# Patient Record
Sex: Male | Born: 1975 | Race: White | Hispanic: No | Marital: Married | Smoking: Former smoker
Health system: Southern US, Community
[De-identification: ages and names within clinical notes are randomized; demographics above are authoritative.]

## PROBLEM LIST (undated history)

## (undated) DIAGNOSIS — M1990 Unspecified osteoarthritis, unspecified site: Secondary | ICD-10-CM

## (undated) DIAGNOSIS — Z8659 Personal history of other mental and behavioral disorders: Secondary | ICD-10-CM

## (undated) DIAGNOSIS — R002 Palpitations: Secondary | ICD-10-CM

## (undated) DIAGNOSIS — F419 Anxiety disorder, unspecified: Secondary | ICD-10-CM

## (undated) DIAGNOSIS — I10 Essential (primary) hypertension: Secondary | ICD-10-CM

## (undated) HISTORY — PX: CYST EXCISION: SHX5701

## (undated) HISTORY — PX: SHOULDER SURGERY: SHX246

---

## 2004-02-26 ENCOUNTER — Ambulatory Visit: Payer: Self-pay | Admitting: Family Medicine

## 2004-12-05 ENCOUNTER — Ambulatory Visit: Payer: Self-pay | Admitting: Family Medicine

## 2013-01-20 ENCOUNTER — Ambulatory Visit: Payer: Self-pay | Admitting: Orthopedic Surgery

## 2015-10-26 ENCOUNTER — Other Ambulatory Visit: Payer: Self-pay | Admitting: Student

## 2015-10-26 DIAGNOSIS — M542 Cervicalgia: Secondary | ICD-10-CM

## 2015-10-26 DIAGNOSIS — M5413 Radiculopathy, cervicothoracic region: Secondary | ICD-10-CM

## 2015-11-05 ENCOUNTER — Ambulatory Visit
Admission: RE | Admit: 2015-11-05 | Discharge: 2015-11-05 | Disposition: A | Discharge status: Home / Self Care | Payer: Managed Care, Other (non HMO) | Source: Ambulatory Visit | Attending: Student | Admitting: Student

## 2015-11-05 DIAGNOSIS — M542 Cervicalgia: Secondary | ICD-10-CM

## 2015-11-05 DIAGNOSIS — M47892 Other spondylosis, cervical region: Secondary | ICD-10-CM | POA: Insufficient documentation

## 2015-11-05 DIAGNOSIS — M47894 Other spondylosis, thoracic region: Secondary | ICD-10-CM | POA: Diagnosis not present

## 2015-11-05 DIAGNOSIS — M5413 Radiculopathy, cervicothoracic region: Secondary | ICD-10-CM

## 2015-11-05 DIAGNOSIS — M50222 Other cervical disc displacement at C5-C6 level: Secondary | ICD-10-CM | POA: Insufficient documentation

## 2015-11-05 DIAGNOSIS — M5124 Other intervertebral disc displacement, thoracic region: Secondary | ICD-10-CM | POA: Diagnosis not present

## 2015-11-05 DIAGNOSIS — M4802 Spinal stenosis, cervical region: Secondary | ICD-10-CM | POA: Diagnosis not present

## 2017-06-22 ENCOUNTER — Other Ambulatory Visit: Payer: Self-pay | Admitting: General Surgery

## 2017-06-22 DIAGNOSIS — R1031 Right lower quadrant pain: Secondary | ICD-10-CM

## 2017-06-28 ENCOUNTER — Ambulatory Visit
Admission: RE | Admit: 2017-06-28 | Discharge: 2017-06-28 | Disposition: A | Discharge status: Home / Self Care | Payer: 59 | Source: Ambulatory Visit | Attending: General Surgery | Admitting: General Surgery

## 2017-06-28 DIAGNOSIS — R1031 Right lower quadrant pain: Secondary | ICD-10-CM | POA: Diagnosis present

## 2017-06-28 DIAGNOSIS — K4090 Unilateral inguinal hernia, without obstruction or gangrene, not specified as recurrent: Secondary | ICD-10-CM | POA: Insufficient documentation

## 2017-06-28 DIAGNOSIS — K760 Fatty (change of) liver, not elsewhere classified: Secondary | ICD-10-CM | POA: Diagnosis not present

## 2017-06-28 MED ORDER — IOPAMIDOL (ISOVUE-300) INJECTION 61%
100.0000 mL | Freq: Once | INTRAVENOUS | Status: AC | PRN
  Administered 2017-06-28: 100 mL via INTRAVENOUS

## 2018-02-19 ENCOUNTER — Inpatient Hospital Stay
Admission: RE | Admit: 2018-02-19 | Discharge: 2018-02-19 | Disposition: A | Discharge status: Home / Self Care | Payer: 59 | Source: Ambulatory Visit

## 2018-02-19 NOTE — Pre-Procedure Instructions (Signed)
Progress Notes - documented in this encounter Ladean RayaDrane, [place], PA - 12/24/2017 9:15 AM EST Formatting of this note might be different from the original. Established Patient Visit   Chief Complaint: Chief Complaint  Patient presents with  . 4 mnth follow up  no compliaints  Date of Service: 12/24/2017 Date of Birth: 08-Apr-1975 PCP: Dionisio PaschalBronstein, David Marvin, MD  History of Present Illness: Mr. Charles Mcintyre is a 43 y.o.male patient who returns today for essential hypertension, and a recent history of palpitations, headache and heat intolerance. The patient has a history of essential hypertension, treated with antihypertensive medications for 5 years. During the past 5 years, the patient has been experiencing worsening heat intolerance. He is a Secondary school teacherbaseball coach, works outdoors during the summer, and has noted worsening intolerance to heat with associated progressive headache and palpitations, which proceeds to nausea. At a recent visit, his bisoprolol-hydrochlorothiazide was discontinued, and he was started on metoprolol succinate 50 mg. He reports an overall improvement in his symptoms. The patient denies chest pain or shortness of breath. He denies palpitations or heart racing. He denies presyncope or syncope. The patient is active, does weight lifting 3-4 times a week, but does not do any cardio exercises.  The patient has essential hypertension, blood pressure adequately controlled today, currently on metoprolol succinate, which is well-tolerated without apparent side effects. The patient tries to follow low-sodium, no added salt diet.  Past Medical and Surgical History  Past Medical History Past Medical History:  Diagnosis Date  . Hypertension   Past Surgical History He has a past surgical history that includes cyst on hand.   Medications and Allergies  Current Medications  Current Outpatient Medications  Medication Sig Dispense Refill  . metoprolol succinate (TOPROL-XL) 50 MG XL tablet Take  1 tablet (50 mg total) by mouth once daily 30 tablet 11  . sildenafil, antihypertensive, (REVATIO) 20 mg tablet TAKE 1-5 TABLETS BY MOUTH AS NEEDED BEFORE SEXUAL ACTIVITY 30 tablet 1  . testosterone cypionate (DEPO-TESTOSTERONE) 200 mg/mL injection Inject 1ML Q 14 days 3 mL 1   No current facility-administered medications for this visit.   Allergies: Patient has no known allergies.  Social and Family History  Social History reports that he has quit smoking. He has never used smokeless tobacco. He reports that he does not drink alcohol or use drugs.  Family History Family History  Problem Relation Age of Onset  . High blood pressure (Hypertension) Mother  . High blood pressure (Hypertension) Father  . High blood pressure (Hypertension) Brother  . Myocardial Infarction (Heart attack) Maternal Grandfather   Review of Systems   Review of Systems: The patient denies chest pain, shortness of breath, orthopnea, paroxysmal nocturnal dyspnea, pedal edema, palpitations, heart racing, presyncope, syncope, with heat intolerance and headaches. Review of 10 Systems is negative except as described above.  Physical Examination   Vitals:BP 124/82  Pulse 59  Resp 16  Ht 177.8 cm (5\' 10" )  Wt 98.2 kg (216 lb 7.9 oz)  BMI 31.06 kg/m  Ht:177.8 cm (5\' 10" ) Wt:98.2 kg (216 lb 7.9 oz) ZOX:WRUEBSA:Body surface area is 2.2 meters squared. Body mass index is 31.06 kg/m.  General: Alert and oriented. Well-appearing. No acute distress. HEENT: Pupils equally reactive to light and accomodation  Neck: Supple, no JVD Lungs: Normal effort of breathing; clear to auscultation bilaterally; no wheezes, rales, rhonchi Heart: Regular rate and rhythm. No murmur, rub, or gallop Abdomen: nondistended Extremities: no cyanosis, clubbing, or edema Peripheral Pulses: 2+ radial  Skin: Warm, dry,  no diaphoresis  Assessment   43 y.o. male with  1. Essential hypertension   42 year old gentleman with recent history of  increasing heat intolerance, headache, and palpitations, with elevated blood pressure on bisoprolol/HCTZ, which was discontinued, and switched to metoprolol succinate, with overall improvement in symptoms, with good control of blood pressure. TSH within normal limits.  Plan   1. Continue current medications. 2. Counseled patient about low sodium diet 3. DASH diet printed instructions given to the patient 4. Advised patient to stay well hydrated 5. Return to clinic for follow-up in 6 months, sooner if needed.  No orders of the defined types were placed in this encounter.  Return in about 6 months (around 06/24/2018). I personally performed the service, non-incident to. [facility])  ANNA Domenica Reamer, PA-C   Electronically signed by Ladean Raya, PA at 12/24/2017 12:41 PM EST   Plan of Treatment - documented as of this encounter Upcoming Encounters Upcoming Encounters  Date Type Specialty Care Team Description  02/20/2018 Office Visit Internal Medicine Terance Hart Rogelia Mire, MD  [address]  [place], [place] [address]  [phone]  [phone] (Fax)    03/07/2018 Post Op General Surgery Cintron Netta Cedars, MD  [address]  [place], [place] [address]  [phone]  [phone] (Fax)     Goals - documented as of this encounter Goal Patient Goal Type Associated Problems Recent Progress Patient-Stated? Author  Lose Weight  Weight   [place], [place], [place]  Note:   Pt's goal is to lose weight. Pt will eat less fast food and do cardio 3-5x a week. He will do this at his gym in the am. importance is an 8. Will follow up at next visit.     Visit Diagnoses - documented in this encounter Diagnosis  Essential hypertension - Primary    Discontinued Medications - documented as of this encounter Medication Sig Discontinue Reason Start Date End Date  betamethasone dipropionate, augmented, (DIPROLENE-AF) 0.05 % cream    01/17/2017 12/24/2017    Images  Patient Contacts  Contact Name Contact Address Communication Relationship to Patient  Ensar Aungst [address] [place], [place] [address] [phone] [facility]) [phone] (Home) Spouse, Emergency Contact  Document Information  Primary Care Provider Other Service Providers Document Coverage Dates  Dionisio Paschal, MD (Dec. 21, 2015December 21, 2015 - Present) DM: [phone] (Work) [phone] (Fax)  Family Medicine [facility] [address] [place], [place] [address]  Nov. 11, 2019November 11, 2019   Custodian Organization  [facility] [address] [place], [place] [address]   Encounter Providers Encounter Date  Ladean Raya, [place] (Attending) DM: [phone] (Work) [phone] (Fax) [address] [facility] [place], [place] [address] Cardiovascular Disease Nov. 11, 2019November 11, 2019

## 2018-02-20 ENCOUNTER — Inpatient Hospital Stay: Admission: RE | Admit: 2018-02-20 | Payer: Managed Care, Other (non HMO) | Source: Ambulatory Visit

## 2018-02-21 ENCOUNTER — Encounter
Admission: RE | Admit: 2018-02-21 | Discharge: 2018-02-21 | Disposition: A | Discharge status: Home / Self Care | Payer: Managed Care, Other (non HMO) | Source: Ambulatory Visit | Attending: General Surgery | Admitting: General Surgery

## 2018-02-21 ENCOUNTER — Other Ambulatory Visit: Payer: Self-pay

## 2018-02-21 ENCOUNTER — Other Ambulatory Visit: Payer: Managed Care, Other (non HMO)

## 2018-02-21 ENCOUNTER — Ambulatory Visit: Payer: Self-pay | Admitting: General Surgery

## 2018-02-21 HISTORY — DX: Anxiety disorder, unspecified: F41.9

## 2018-02-21 HISTORY — DX: Palpitations: R00.2

## 2018-02-21 HISTORY — DX: Unspecified osteoarthritis, unspecified site: M19.90

## 2018-02-21 HISTORY — DX: Essential (primary) hypertension: I10

## 2018-02-21 HISTORY — DX: Personal history of other mental and behavioral disorders: Z86.59

## 2018-02-21 NOTE — H&P (Signed)
HISTORY OF PRESENT ILLNESS:    Charles Mcintyre is a 42 y.o.male patient who comes for pre operative evaluation of right inguinal hernia and umbilical hernia.  Charles Mcintyre right groin pain after lifting weights 7-8 months ago. Patient refers continue with pain on the right inguinal area and does not radiates to other part of the body. The pain is aggravated with bending down or moving the proximal thigh. Pain improves spontaneously, patient has not taken any pain medications. Patient refers that the right groin area is more swollen than the left. Denies episodes of abdominal distention, nausea and vomiting.  He was previously evaluated on May 2019. Ct Scan was ordered that confirm the right inguinal and umbilical hernia. I personally evaluated the images.       PAST MEDICAL HISTORY:      Past Medical History:  Diagnosis Date  . Hypertension         PAST SURGICAL HISTORY:        Past Surgical History:  Procedure Laterality Date  . cyst on hand           MEDICATIONS:  EncounterMedications        Outpatient Encounter Medications as of 02/01/2018  Medication Sig Dispense Refill  . metoprolol succinate (TOPROL-XL) 50 MG XL tablet Take 1 tablet (50 mg total) by mouth once daily 30 tablet 11  . sildenafil, pulm.hypertension, (REVATIO) 20 mg tablet TAKE 1-5 TABLETS BY MOUTH AS NEEDED BEFORE SEXUAL ACTIVITY 30 tablet 1  . testosterone cypionate (DEPO-TESTOSTERONE) 200 mg/mL injection INJECT <MEASUREMENTPriciMarcelle Sm55JeSt. BeMarland KitchenrnGa8675 5PriciMarcelle Sm10JeMeMarland KitchenlbGa3474 7PriciMarcelle Sm16JeMarland KitchenAvGa6699 0PriciMarcelle Sm59JePenniMarland KitchenngGa860 7PriciMarcelle Sm45JeMarland KitchenRoGa367 2PriciMarcelle Sm41JeYaMarland KitchennkGa2362 5PriciMarcelle Sm68JeRunning SpMarland KitchenriGa73 9PriciMarcelle Sm92JeFeather Marland KitchenSoGa(8107 0PriciMarcelle Sm52JeCoalMarland Kitchen CGa4508 3PriciMarcelle Sm16JeLake WiscMarland KitchenonGa(9366 6PriciMarcelle Sm29JeRocky Marland KitchenPoGa5406 7PriciMarcelle Sm35JeTheMarland KitchenodGa(8108 4PriciMarcelle Sm55JeEMarland KitchenucGa551 5PriciMarcelle Sm83JeMarland KitchenBoGa22 1PriciMarcelle Sm5JeLewiMarland KitchensbGa9384 1PriciMarcelle Sm38JePowhatan Marland KitchenPoGa610 8PriciMarcelle Sm36JeStraMarland KitchentfGa(820)2 3PriciMarcelle Sm66JeCarrier Marland KitchenMiGa9176 1PriciMarcelle Sm40JeMarland KitchenHaGa8706 5PriciMarcelle Sm68JeWesMarland KitchentwGa8203 6PriciMarcelle Sm3JeMMarland KitchenizGa9108 9PriciMarcelle Sm47JeSavMarland KitchenanGa7059 5PriciMarcelle Sm4JeMarland [phone] 3 mL 1   No facility-administered encounter medications on file as of 02/01/2018.        ALLERGIES:   Patient has no known allergies.   SOCIAL HISTORY:  Social History          Socioeconomic History  . Marital status: Married    Spouse name: Not on file  . Number of children: Not on file  . Years of education: Not on file  . Highest education level: Not on file  Occupational History  . Not on file  Social Needs  . Financial resource strain: Not on  file  . Food insecurity:    Worry: Not on file    Inability: Not on file  . Transportation needs:    Medical: Not on file    Non-medical: Not on file  Tobacco Use  . Smoking status: Former Smoker  . Smokeless tobacco: Never Used  Substance and Sexual Activity  . Alcohol use: No    Alcohol/week: 0.0 standard drinks  . Drug use: No  . Sexual activity: Yes  Other Topics Concern  . Not on file  Social History Narrative   Exercise:  Weights, power lifting 5-6 times a week.   Diet:  Red meat 4-5 times a week.  Fast foods 5 times a week.   Fried foods 5 times a week.      FAMILY HISTORY:       Family History  Problem Relation Age of Onset  . High blood pressure (Hypertension) Mother   . High blood pressure (Hypertension) Father   . High blood pressure (Hypertension) Brother   . Myocardial Infarction (Heart attack) Maternal Grandfather      GENERAL REVIEW OF SYSTEMS:   General ROS: negative for - chills, fatigue, fever, weight gain or weight loss Allergy and Immunology ROS: negative for - hives  Hematological and Lymphatic ROS: negative for - bleeding problems or bruising, negative for palpable nodes Endocrine ROS:  negative for - heat or cold intolerance, hair changes Respiratory ROS: negative for - cough, shortness of breath or wheezing Cardiovascular ROS: no chest pain or palpitations GI ROS: negative for nausea, vomiting, abdominal pain, diarrhea, constipation Musculoskeletal ROS: negative for - joint swelling or muscle pain Neurological ROS: negative for - confusion, syncope Dermatological ROS: negative for pruritus and rash  PHYSICAL EXAM:     Vitals:   02/01/18 0928  BP: (!) 146/90  Pulse: 78  Temp: 36.7 C (98 F)  .  Ht:177.8 cm (5\' 10" ) Wt:98.2 kg (216 lb 7.9 oz) ZSM:OLMB surface area is 2.2 meters squared. Body mass index is 31.06 kg/m.Marland Kitchen   GENERAL: Alert, active, oriented x3  HEENT: Pupils equal reactive to light. Extraocular  movements are intact. Sclera clear. Palpebral conjunctiva normal red color.Pharynx clear.  NECK: Supple with no palpable mass and no adenopathy.  LUNGS: Sound clear with no rales rhonchi or wheezes.  HEART: Regular rhythm S1 and S2 without murmur.  ABDOMEN: Soft and depressible, nontender with no palpable mass, no hepatomegaly. Right inguinal hernia, reducible, minimal tenderness. Small umbilical hernia reducible, soft and mild tender. Epigastric area of tenderness, no bulging.   EXTREMITIES: Well-developed well-nourished symmetrical with no dependent edema.  NEUROLOGICAL: Awake alert oriented, facial expression symmetrical, moving all extremities.      IMPRESSION:    Right inguinal hernia, symptomatic - Symptomatic, patient oriented about surgical procedure, goals and benefit. Open and laparoscopic repair were discussed with patient and patient elect to have laparoscopic repair. Patient oriented about risk of procedure including injury to vas deferens, injury to arteries, veins, intestine, bladder, infection and chronic pain.  Umbilical hernia, symptomatic - Symptomatic, will have open repair after laparoscopic hernia repair. Will be done from a separate incision since the TEP inguinal hernia repair does not access the abdominal cavity.   Suspected epigastric hernia - Will consider doing diagnostic laparoscopy and if positive repair, laparoscopic vs open.  This recommendations were discussed with patient including the risk of surgery. The patient refers will like to have everything repaired at once.            PLAN:  1. Laparoscopic Right inguinal hernia repair with mesh (86754) 2. Open umbilical hernia repair with mesh (49201) 3. Possible ventral hernia repair with mesh open vs laparoscopic (49560, [id number], 49653) 4. CBC, CMP 5. Note from cardiology for clearance 6. Do not take aspirin 5 days before surgery 7 Contact us if has any question or concern.   Patient  verbalized understanding, all questions were answered, and were agreeable with the plan outlined above.   Carolan Shiver, MD  Electronically signed by Carolan Shiver, MD

## 2018-02-21 NOTE — H&P (View-Only) (Signed)
HISTORY OF PRESENT ILLNESS:    Mr. Mcbean is a 42 y.o.male patient who comes for pre operative evaluation of right inguinal hernia and umbilical hernia.  Mr.Pomareportshaving right groin pain after lifting weights 7-8 months ago. Patient refers continue with pain on the right inguinal area and does not radiates to other part of the body. The pain is aggravated with bending down or moving the proximal thigh. Pain improves spontaneously, patient has not taken any pain medications. Patient refers that the right groin area is more swollen than the left. Denies episodes of abdominal distention, nausea and vomiting.  He was previously evaluated on May 2019. Ct Scan was ordered that confirm the right inguinal and umbilical hernia. I personally evaluated the images.       PAST MEDICAL HISTORY:      Past Medical History:  Diagnosis Date  . Hypertension         PAST SURGICAL HISTORY:        Past Surgical History:  Procedure Laterality Date  . cyst on hand           MEDICATIONS:  EncounterMedications        Outpatient Encounter Medications as of 02/01/2018  Medication Sig Dispense Refill  . metoprolol succinate (TOPROL-XL) 50 MG XL tablet Take 1 tablet (50 mg total) by mouth once daily 30 tablet 11  . sildenafil, pulm.hypertension, (REVATIO) 20 mg tablet TAKE 1-5 TABLETS BY MOUTH AS NEEDED BEFORE SEXUAL ACTIVITY 30 tablet 1  . testosterone cypionate (DEPO-TESTOSTERONE) 200 mg/mL injection INJECT 1ML EVERY 14 DAYS 3 mL 1   No facility-administered encounter medications on file as of 02/01/2018.        ALLERGIES:   Patient has no known allergies.   SOCIAL HISTORY:  Social History          Socioeconomic History  . Marital status: Married    Spouse name: Not on file  . Number of children: Not on file  . Years of education: Not on file  . Highest education level: Not on file  Occupational History  . Not on file  Social Needs  . Financial resource strain: Not on  file  . Food insecurity:    Worry: Not on file    Inability: Not on file  . Transportation needs:    Medical: Not on file    Non-medical: Not on file  Tobacco Use  . Smoking status: Former Smoker  . Smokeless tobacco: Never Used  Substance and Sexual Activity  . Alcohol use: No    Alcohol/week: 0.0 standard drinks  . Drug use: No  . Sexual activity: Yes  Other Topics Concern  . Not on file  Social History Narrative   Exercise:  Weights, power lifting 5-6 times a week.   Diet:  Red meat 4-5 times a week.  Fast foods 5 times a week.   Fried foods 5 times a week.      FAMILY HISTORY:       Family History  Problem Relation Age of Onset  . High blood pressure (Hypertension) Mother   . High blood pressure (Hypertension) Father   . High blood pressure (Hypertension) Brother   . Myocardial Infarction (Heart attack) Maternal Grandfather      GENERAL REVIEW OF SYSTEMS:   General ROS: negative for - chills, fatigue, fever, weight gain or weight loss Allergy and Immunology ROS: negative for - hives  Hematological and Lymphatic ROS: negative for - bleeding problems or bruising, negative for palpable nodes Endocrine ROS:   negative for - heat or cold intolerance, hair changes Respiratory ROS: negative for - cough, shortness of breath or wheezing Cardiovascular ROS: no chest pain or palpitations GI ROS: negative for nausea, vomiting, abdominal pain, diarrhea, constipation Musculoskeletal ROS: negative for - joint swelling or muscle pain Neurological ROS: negative for - confusion, syncope Dermatological ROS: negative for pruritus and rash  PHYSICAL EXAM:     Vitals:   02/01/18 0928  BP: (!) 146/90  Pulse: 78  Temp: 36.7 C (98 F)  .  Ht:177.8 cm (5\' 10" ) Wt:98.2 kg (216 lb 7.9 oz) ZSM:OLMB surface area is 2.2 meters squared. Body mass index is 31.06 kg/m.Marland Kitchen   GENERAL: Alert, active, oriented x3  HEENT: Pupils equal reactive to light. Extraocular  movements are intact. Sclera clear. Palpebral conjunctiva normal red color.Pharynx clear.  NECK: Supple with no palpable mass and no adenopathy.  LUNGS: Sound clear with no rales rhonchi or wheezes.  HEART: Regular rhythm S1 and S2 without murmur.  ABDOMEN: Soft and depressible, nontender with no palpable mass, no hepatomegaly. Right inguinal hernia, reducible, minimal tenderness. Small umbilical hernia reducible, soft and mild tender. Epigastric area of tenderness, no bulging.   EXTREMITIES: Well-developed well-nourished symmetrical with no dependent edema.  NEUROLOGICAL: Awake alert oriented, facial expression symmetrical, moving all extremities.      IMPRESSION:    Right inguinal hernia, symptomatic - Symptomatic, patient oriented about surgical procedure, goals and benefit. Open and laparoscopic repair were discussed with patient and patient elect to have laparoscopic repair. Patient oriented about risk of procedure including injury to vas deferens, injury to arteries, veins, intestine, bladder, infection and chronic pain.  Umbilical hernia, symptomatic - Symptomatic, will have open repair after laparoscopic hernia repair. Will be done from a separate incision since the TEP inguinal hernia repair does not access the abdominal cavity.   Suspected epigastric hernia - Will consider doing diagnostic laparoscopy and if positive repair, laparoscopic vs open.  This recommendations were discussed with patient including the risk of surgery. The patient refers will like to have everything repaired at once.            PLAN:  1. Laparoscopic Right inguinal hernia repair with mesh (86754) 2. Open umbilical hernia repair with mesh (49201) 3. Possible ventral hernia repair with mesh open vs laparoscopic (49560, [id number], [id number]) 4. CBC, CMP 5. Note from cardiology for clearance 6. Do not take aspirin 5 days before surgery 7 Contact us if has any question or concern.   Patient  verbalized understanding, all questions were answered, and were agreeable with the plan outlined above.   Carolan Shiver, MD  Electronically signed by Carolan Shiver, MD

## 2018-02-21 NOTE — Patient Instructions (Signed)
Your procedure is scheduled on: 03-01-18 FRIDAY Report to Same Day Surgery 2nd floor medical mall [facility] Entrance-take elevator on left to 2nd floor.  Check in with surgery information desk.) To find out your arrival time please call [phone] between 1PM - 3PM on 02-28-18 THURSDAY  Remember: Instructions that are not followed completely may result in serious medical risk, up to and including death, or upon the discretion of your surgeon and anesthesiologist your surgery may need to be rescheduled.    _x___ 1. Do not eat food after midnight the night before your procedure. NO GUM OR CANDY AFTER MIDNIGHT. You may drink clear liquids up to 2 hours before you are scheduled to arrive at the hospital for your procedure.  Do not drink clear liquids within 2 hours of your scheduled arrival to the hospital.  Clear liquids include  --Water or Apple juice without pulp  --Clear carbohydrate beverage such as ClearFast or Gatorade  --Black Coffee or Clear Tea (No milk, no creamers, do not add anything to the coffee or Tea   ____Ensure clear carbohydrate drink on the way to the hospital for bariatric patients  ____Ensure clear carbohydrate drink 3 hours before surgery for Dr Rutherford Nail patients if physician instructed.     __x__ 2. No Alcohol for 24 hours before or after surgery.   __x__3. No Smoking or e-cigarettes for 24 prior to surgery.  Do not use any chewable tobacco products for at least 6 hour prior to surgery   ____  4. Bring all medications with you on the day of surgery if instructed.    __x__ 5. Notify your doctor if there is any change in your medical condition     (cold, fever, infections).    x___6. On the morning of surgery brush your teeth with toothpaste and water.  You may rinse your mouth with mouth wash if you wish.  Do not swallow any toothpaste or mouthwash.   Do not wear jewelry, make-up, hairpins, clips or nail polish.  Do not wear lotions, powders, or perfumes.  You may wear deodorant.  Do not shave 48 hours prior to surgery. Men may shave face and neck.  Do not bring valuables to the hospital.    [facility] is not responsible for any belongings or valuables.               Contacts, dentures or bridgework may not be worn into surgery.  Leave your suitcase in the car. After surgery it may be brought to your room.  For patients admitted to the hospital, discharge time is determined by your treatment team.  _  Patients discharged the day of surgery will not be allowed to drive home.  You will need someone to drive you home and stay with you the night of your procedure.    Please read over the following fact sheets that you were given:   [facility] Preparing for Surgery   _x___ TAKE THE FOLLOWING MEDICATION THE MORNING OF SURGERY WITH A SMALL SIP OF WATER. These include:  1. METOPROLOL  2.  3.  4.  5.  6.  ____Fleets enema or Magnesium Citrate as directed.   _x___ Use CHG Soap or sage wipes as directed on instruction sheet   ____ Use inhalers on the day of surgery and bring to hospital day of surgery  ____ Stop Metformin and Janumet 2 days prior to surgery.    ____ Take 1/2 of usual insulin dose the night before surgery  and none on the morning surgery.   ____ Follow recommendations from Cardiologist, Pulmonologist or PCP regarding stopping Aspirin, Coumadin, Plavix ,Eliquis, Effient, or Pradaxa, and Pletal.  X____Stop Anti-inflammatories such as Advil, Aleve, Ibuprofen, Motrin, Naproxen, Naprosyn, Goodies powders or aspirin products NOW OK to take Tylenol    _x___ Stop supplements until after surgery-STOP TURMERIC NOW-MAY RESUME AFTER SURGERY   ____ Bring C-Pap to the hospital.

## 2018-02-25 ENCOUNTER — Inpatient Hospital Stay: Admission: RE | Admit: 2018-02-25 | Payer: Managed Care, Other (non HMO) | Source: Ambulatory Visit

## 2018-02-28 MED ORDER — CEFAZOLIN SODIUM-DEXTROSE 2-4 GM/100ML-% IV SOLN
2.0000 g | INTRAVENOUS | Status: AC
  Administered 2018-03-01: 2 g via INTRAVENOUS

## 2018-03-01 ENCOUNTER — Encounter
Admission: RE | Disposition: A | Discharge status: Home / Self Care | Payer: Self-pay | Source: Home / Self Care | Attending: General Surgery

## 2018-03-01 ENCOUNTER — Ambulatory Visit: Payer: Managed Care, Other (non HMO)

## 2018-03-01 ENCOUNTER — Encounter: Payer: Self-pay | Admitting: Emergency Medicine

## 2018-03-01 ENCOUNTER — Ambulatory Visit
Admission: RE | Admit: 2018-03-01 | Discharge: 2018-03-01 | Disposition: A | Discharge status: Home / Self Care | Payer: Managed Care, Other (non HMO) | Attending: General Surgery | Admitting: General Surgery

## 2018-03-01 DIAGNOSIS — I10 Essential (primary) hypertension: Secondary | ICD-10-CM | POA: Diagnosis not present

## 2018-03-01 DIAGNOSIS — K4090 Unilateral inguinal hernia, without obstruction or gangrene, not specified as recurrent: Secondary | ICD-10-CM | POA: Insufficient documentation

## 2018-03-01 DIAGNOSIS — Z79890 Hormone replacement therapy (postmenopausal): Secondary | ICD-10-CM | POA: Diagnosis not present

## 2018-03-01 DIAGNOSIS — R002 Palpitations: Secondary | ICD-10-CM | POA: Diagnosis not present

## 2018-03-01 DIAGNOSIS — K429 Umbilical hernia without obstruction or gangrene: Secondary | ICD-10-CM | POA: Diagnosis not present

## 2018-03-01 DIAGNOSIS — Z79899 Other long term (current) drug therapy: Secondary | ICD-10-CM | POA: Diagnosis not present

## 2018-03-01 DIAGNOSIS — I272 Pulmonary hypertension, unspecified: Secondary | ICD-10-CM | POA: Diagnosis not present

## 2018-03-01 DIAGNOSIS — Z87891 Personal history of nicotine dependence: Secondary | ICD-10-CM | POA: Diagnosis not present

## 2018-03-01 HISTORY — PX: UMBILICAL HERNIA REPAIR: SHX196

## 2018-03-01 HISTORY — PX: INGUINAL HERNIA REPAIR: SHX194

## 2018-03-01 SURGERY — REPAIR, HERNIA, INGUINAL, LAPAROSCOPIC
Anesthesia: General | Laterality: Right

## 2018-03-01 MED ORDER — BUPIVACAINE LIPOSOME 1.3 % IJ SUSP
INTRAMUSCULAR | Status: DC | PRN
  Administered 2018-03-01: 20 mL

## 2018-03-01 MED ORDER — LACTATED RINGERS IV SOLN
INTRAVENOUS | Status: DC
  Administered 2018-03-01 (×2): via INTRAVENOUS

## 2018-03-01 MED ORDER — EPHEDRINE SULFATE 50 MG/ML IJ SOLN
INTRAMUSCULAR | Status: DC | PRN
  Administered 2018-03-01 (×2): 5 mg via INTRAVENOUS

## 2018-03-01 MED ORDER — ROCURONIUM BROMIDE 50 MG/5ML IV SOLN
INTRAVENOUS | Status: AC
  Filled 2018-03-01: qty 1

## 2018-03-01 MED ORDER — HYDROCODONE-ACETAMINOPHEN 5-325 MG PO TABS: 1.0000 | ORAL_TABLET | ORAL | 0 refills | Status: AC | PRN

## 2018-03-01 MED ORDER — BUPIVACAINE-EPINEPHRINE (PF) 0.5% -1:200000 IJ SOLN
INTRAMUSCULAR | Status: AC
  Filled 2018-03-01: qty 30

## 2018-03-01 MED ORDER — FAMOTIDINE 20 MG PO TABS
ORAL_TABLET | ORAL | Status: AC
  Administered 2018-03-01: 20 mg via ORAL
  Filled 2018-03-01: qty 1

## 2018-03-01 MED ORDER — ONDANSETRON HCL 4 MG/2ML IJ SOLN: 4.0000 mg | Freq: Once | INTRAMUSCULAR | Status: DC | PRN

## 2018-03-01 MED ORDER — ONDANSETRON HCL 4 MG/2ML IJ SOLN
INTRAMUSCULAR | Status: AC
  Filled 2018-03-01: qty 2

## 2018-03-01 MED ORDER — SUCCINYLCHOLINE CHLORIDE 20 MG/ML IJ SOLN
INTRAMUSCULAR | Status: DC | PRN
  Administered 2018-03-01: 100 mg via INTRAVENOUS

## 2018-03-01 MED ORDER — FENTANYL CITRATE (PF) 100 MCG/2ML IJ SOLN
INTRAMUSCULAR | Status: DC | PRN
  Administered 2018-03-01: 100 ug via INTRAVENOUS
  Administered 2018-03-01: 50 ug via INTRAVENOUS

## 2018-03-01 MED ORDER — LIDOCAINE HCL (PF) 2 % IJ SOLN
INTRAMUSCULAR | Status: AC
  Filled 2018-03-01: qty 10

## 2018-03-01 MED ORDER — ROCURONIUM BROMIDE 100 MG/10ML IV SOLN
INTRAVENOUS | Status: DC | PRN
  Administered 2018-03-01: 10 mg via INTRAVENOUS
  Administered 2018-03-01: 5 mg via INTRAVENOUS
  Administered 2018-03-01: 10 mg via INTRAVENOUS
  Administered 2018-03-01: 35 mg via INTRAVENOUS
  Administered 2018-03-01 (×3): 10 mg via INTRAVENOUS
  Administered 2018-03-01: 20 mg via INTRAVENOUS

## 2018-03-01 MED ORDER — ONDANSETRON HCL 4 MG/2ML IJ SOLN
INTRAMUSCULAR | Status: DC | PRN
  Administered 2018-03-01: 4 mg via INTRAVENOUS

## 2018-03-01 MED ORDER — FAMOTIDINE 20 MG PO TABS
20.0000 mg | ORAL_TABLET | Freq: Once | ORAL | Status: AC
  Administered 2018-03-01: 20 mg via ORAL

## 2018-03-01 MED ORDER — FENTANYL CITRATE (PF) 100 MCG/2ML IJ SOLN
25.0000 ug | INTRAMUSCULAR | Status: DC | PRN
  Administered 2018-03-01 (×4): 25 ug via INTRAVENOUS

## 2018-03-01 MED ORDER — PROPOFOL 10 MG/ML IV BOLUS
INTRAVENOUS | Status: AC
  Filled 2018-03-01: qty 20

## 2018-03-01 MED ORDER — DEXAMETHASONE SODIUM PHOSPHATE 10 MG/ML IJ SOLN
INTRAMUSCULAR | Status: DC | PRN
  Administered 2018-03-01: 10 mg via INTRAVENOUS

## 2018-03-01 MED ORDER — MIDAZOLAM HCL 2 MG/2ML IJ SOLN
INTRAMUSCULAR | Status: AC
  Filled 2018-03-01: qty 2

## 2018-03-01 MED ORDER — FENTANYL CITRATE (PF) 100 MCG/2ML IJ SOLN
INTRAMUSCULAR | Status: AC
  Administered 2018-03-01: 25 ug via INTRAVENOUS
  Filled 2018-03-01: qty 2

## 2018-03-01 MED ORDER — CEFAZOLIN SODIUM-DEXTROSE 2-4 GM/100ML-% IV SOLN
INTRAVENOUS | Status: AC
  Filled 2018-03-01: qty 100

## 2018-03-01 MED ORDER — BUPIVACAINE-EPINEPHRINE (PF) 0.25% -1:200000 IJ SOLN
INTRAMUSCULAR | Status: AC
  Filled 2018-03-01: qty 30

## 2018-03-01 MED ORDER — SUGAMMADEX SODIUM 200 MG/2ML IV SOLN
INTRAVENOUS | Status: DC | PRN
  Administered 2018-03-01: 200 mg via INTRAVENOUS

## 2018-03-01 MED ORDER — PROPOFOL 10 MG/ML IV BOLUS
INTRAVENOUS | Status: DC | PRN
  Administered 2018-03-01: 200 mg via INTRAVENOUS

## 2018-03-01 MED ORDER — BUPIVACAINE HCL (PF) 0.25 % IJ SOLN
INTRAMUSCULAR | Status: DC | PRN
  Administered 2018-03-01: 26 mL

## 2018-03-01 MED ORDER — SODIUM CHLORIDE (PF) 0.9 % IJ SOLN
INTRAMUSCULAR | Status: AC
  Filled 2018-03-01: qty 50

## 2018-03-01 MED ORDER — SUGAMMADEX SODIUM 200 MG/2ML IV SOLN
INTRAVENOUS | Status: AC
  Filled 2018-03-01: qty 2

## 2018-03-01 MED ORDER — FENTANYL CITRATE (PF) 250 MCG/5ML IJ SOLN
INTRAMUSCULAR | Status: AC
  Filled 2018-03-01: qty 5

## 2018-03-01 MED ORDER — DEXMEDETOMIDINE HCL 200 MCG/2ML IV SOLN
INTRAVENOUS | Status: DC | PRN
  Administered 2018-03-01: 8 ug via INTRAVENOUS
  Administered 2018-03-01 (×2): 4 ug via INTRAVENOUS
  Administered 2018-03-01: 8 ug via INTRAVENOUS

## 2018-03-01 MED ORDER — LIDOCAINE HCL (PF) 4 % IJ SOLN
INTRAMUSCULAR | Status: DC | PRN
  Administered 2018-03-01: 3 mL via RESPIRATORY_TRACT

## 2018-03-01 MED ORDER — MIDAZOLAM HCL 2 MG/2ML IJ SOLN
INTRAMUSCULAR | Status: DC | PRN
  Administered 2018-03-01: 2 mg via INTRAVENOUS

## 2018-03-01 MED ORDER — BUPIVACAINE LIPOSOME 1.3 % IJ SUSP
INTRAMUSCULAR | Status: AC
  Filled 2018-03-01: qty 20

## 2018-03-01 MED ORDER — HYDROCODONE-ACETAMINOPHEN 5-325 MG PO TABS
ORAL_TABLET | ORAL | Status: AC
  Administered 2018-03-01: 1
  Filled 2018-03-01: qty 1

## 2018-03-01 MED ORDER — DEXAMETHASONE SODIUM PHOSPHATE 10 MG/ML IJ SOLN
INTRAMUSCULAR | Status: AC
  Filled 2018-03-01: qty 1

## 2018-03-01 MED ORDER — LIDOCAINE HCL (CARDIAC) PF 100 MG/5ML IV SOSY
PREFILLED_SYRINGE | INTRAVENOUS | Status: DC | PRN
  Administered 2018-03-01: 100 mg via INTRAVENOUS

## 2018-03-01 SURGICAL SUPPLY — 72 items
BLADE CLIPPER SURG (BLADE) ×4 IMPLANT
BLADE SURG 15 STRL LF DISP TIS (BLADE) ×3 IMPLANT
BLADE SURG 15 STRL SS (BLADE) ×1
BLADE SURG SZ11 CARB STEEL (BLADE) ×4 IMPLANT
CANISTER SUCT 1200ML W/VALVE (MISCELLANEOUS) ×4 IMPLANT
CHLORAPREP W/TINT 26ML (MISCELLANEOUS) ×4 IMPLANT
COVER LIGHT HANDLE STERIS (MISCELLANEOUS) ×1 IMPLANT
COVER WAND RF STERILE (DRAPES) ×4 IMPLANT
DEFOGGER SCOPE WARMER CLEARIFY (MISCELLANEOUS) ×1 IMPLANT
DERMABOND ADVANCED (GAUZE/BANDAGES/DRESSINGS) ×1
DERMABOND ADVANCED .7 DNX12 (GAUZE/BANDAGES/DRESSINGS) ×3 IMPLANT
DEVICE SECURE STRAP 25 ABSORB (INSTRUMENTS) ×4 IMPLANT
DISSECT BALLN SPACEMKR OVL PDB (BALLOONS) ×4
DISSECTOR BALLN SPCMKR OVL PDB (BALLOONS) ×3 IMPLANT
DRAPE LAPAROTOMY 77X122 PED (DRAPES) ×3 IMPLANT
ELECT REM PT RETURN 9FT ADLT (ELECTROSURGICAL) ×4
ELECTRODE REM PT RTRN 9FT ADLT (ELECTROSURGICAL) ×3 IMPLANT
GAUZE SPONGE 4X4 12PLY STRL (GAUZE/BANDAGES/DRESSINGS) ×4 IMPLANT
GLOVE BIO SURGEON STRL SZ 6.5 (GLOVE) ×9 IMPLANT
GLOVE BIO SURGEON STRL SZ7.5 (GLOVE) ×4 IMPLANT
GLOVE BIOGEL M 6.5 STRL (GLOVE) ×6 IMPLANT
GOWN STRL REUS W/ TWL LRG LVL3 (GOWN DISPOSABLE) ×9 IMPLANT
GOWN STRL REUS W/TWL LRG LVL3 (GOWN DISPOSABLE) ×5
HANDLE YANKAUER SUCT BULB TIP (MISCELLANEOUS) ×1 IMPLANT
IRRIGATION STRYKERFLOW (MISCELLANEOUS) ×3 IMPLANT
IRRIGATOR STRYKERFLOW (MISCELLANEOUS) ×4
IV NS 1000ML (IV SOLUTION) ×1
IV NS 1000ML BAXH (IV SOLUTION) ×3 IMPLANT
JELLY LUB 2OZ STRL (MISCELLANEOUS) ×1
JELLY LUBE 2OZ STRL (MISCELLANEOUS) ×3 IMPLANT
KIT TURNOVER KIT A (KITS) ×4 IMPLANT
KITTNER LAPARASCOPIC 5X40 (MISCELLANEOUS) ×5 IMPLANT
LABEL OR SOLS (LABEL) ×4 IMPLANT
MESH 3DMAX 4X6 RT LRG (Mesh General) ×1 IMPLANT
MESH VENTRALEX ST 2.5 CRC MED (Mesh General) ×1 IMPLANT
NDL FILTER BLUNT 18X1 1/2 (NEEDLE) ×3 IMPLANT
NDL HPO THNWL 1X22GA REG BVL (NEEDLE) ×3 IMPLANT
NDL HYPO 25X1 1.5 SAFETY (NEEDLE) ×3 IMPLANT
NEEDLE FILTER BLUNT 18X 1/2SAF (NEEDLE) ×1
NEEDLE FILTER BLUNT 18X1 1/2 (NEEDLE) ×3 IMPLANT
NEEDLE HYPO 25X1 1.5 SAFETY (NEEDLE) ×4 IMPLANT
NEEDLE SAFETY 22GX1 (NEEDLE) ×1
NEEDLE VERESS 14GA 120MM (NEEDLE) ×3 IMPLANT
NS IRRIG 500ML POUR BTL (IV SOLUTION) ×4 IMPLANT
PACK BASIN MINOR ARMC (MISCELLANEOUS) ×4 IMPLANT
PACK LAP CHOLECYSTECTOMY (MISCELLANEOUS) ×4 IMPLANT
PENCIL ELECTRO HAND CTR (MISCELLANEOUS) ×4 IMPLANT
SCISSORS METZENBAUM CVD 33 (INSTRUMENTS) ×4 IMPLANT
SEAL FOR SCOPE WARMER C3101 (MISCELLANEOUS) ×3 IMPLANT
SLEEVE ENDOPATH XCEL 5M (ENDOMECHANICALS) ×3 IMPLANT
SUT ETHILON 5-0 FS-2 18 BLK (SUTURE) ×8 IMPLANT
SUT MNCRL 4-0 (SUTURE) ×1
SUT MNCRL 4-0 27XMFL (SUTURE) ×3
SUT MNCRL AB 4-0 PS2 18 (SUTURE) IMPLANT
SUT PDS AB 0 CT1 27 (SUTURE) ×4 IMPLANT
SUT PROLENE 0 CT 2 (SUTURE) ×8 IMPLANT
SUT VIC AB 0 CT1 36 (SUTURE) ×4 IMPLANT
SUT VIC AB 2-0 CT2 27 (SUTURE) ×4 IMPLANT
SUT VIC AB 2-0 SH 27 (SUTURE) ×2
SUT VIC AB 2-0 SH 27XBRD (SUTURE) ×6 IMPLANT
SUT VICRYL 0 AB UR-6 (SUTURE) ×4 IMPLANT
SUTURE MNCRL 4-0 27XMF (SUTURE) ×3 IMPLANT
SYR 10ML LL (SYRINGE) ×4 IMPLANT
SYR 5ML LL (SYRINGE) ×4 IMPLANT
TACKER 5MM HERNIA 3.5CML NAB (ENDOMECHANICALS) ×3 IMPLANT
TRAY FOLEY MTR SLVR 16FR STAT (SET/KITS/TRAYS/PACK) ×4 IMPLANT
TROCAR 5MM SINGLE VERSAONE (TROCAR) ×8 IMPLANT
TROCAR BALLN 10M OMST10SB SPAC (TROCAR) ×4 IMPLANT
TROCAR XCEL NON-BLD 11X100MML (ENDOMECHANICALS) ×3 IMPLANT
TROCAR XCEL NON-BLD 5MMX100MML (ENDOMECHANICALS) ×3 IMPLANT
TUBING INSUFFLATION (TUBING) ×4 IMPLANT
WATER STERILE IRR 1000ML POUR (IV SOLUTION) ×4 IMPLANT

## 2018-03-01 NOTE — Op Note (Signed)
Preoperative diagnosis: Right inguinal hernia.                                              Umbilical hernia                                               Postoperative diagnosis: Right inguinal hernia.                                               Umbilical hernia  Procedure: 1. Laparoscopic Total extraperitoneal laparoscopic (TEP) repair of right inguinal hernia.  2. Open umbilical hernia repair with mesh Anesthesia: GETA   Surgeon: Dr. Hazle Quantintron Diaz   Assistant Surgeon: Dr. Tonna BoehringerSakai   Wound Classification: Clean  Indications:  A 43 year old male with right inguinal pain and CT scan that showed a right inguinal hernia. There was umbilical discomfort too and both hernias were indicated to be repaired to improve pain and avoid complications such as incarceration, strangulation and/or obstruction.     Findings: 1. Right Inguinal hernia identified.  2. Vas deferens and cord structures identified and preserved 3. Large Bard 3D Max mesh used for repair 4. No ventral hernia identified.    Description of procedure: The patient was taken to the operating room and the correct side of surgery was verified. The patient was placed supine with arms tucked at the sides. After obtaining adequate anesthesia, the patient's abdomen was prepped and draped in standard sterile fashion. The patient was placed in the Trendelenburg position. A time-out was completed verifying correct patient, procedure, site, positioning, and implant(s) and/or special equipment prior to beginning this procedure.  Left umbilical incision was done and cutdown was done down to anterior fascia. The anterior fascia was incised. Blunt dissection with finger was done below the anterior fascia between the rectus muscle. A spacer was inserted and insufflated under direct visualization. An 11-mm trocar was placed infraumbilically and the 30 angled laparoscope inserted. Two 5-mm trocars were then placed, one suprapubic and another one between  both trocars. Right inguinal area inspected was the site of symptoms. Dissection was started medial to lateral above the pubis ramus. The inferior epigastric vessels were exposed and the pubic symphysis was identified. The peritoneal flap was mobilized inferiorly using blunt and sharp dissection.  Cooper's ligament was dissected to its junction with the iliac vein. The dissection was continued inferiorly to the iliopubic tract, with care taken to avoid injury to the femoral branch of the genitofemoral nerve and the lateral femoral cutaneous nerve. The cord structures were parietalized. A large hernia was identified and reduced by gentle traction. The indirect hernia sac was noted mobilized from the cord structures and reduced including a small lipoma of the cord. A 3D Max Large piece of mesh was rolled longitudinally into a compact cylinder and passed through a trocar. The cylinder was placed along the inferior aspect of the working space and unrolled into place to completely cover the direct, indirect, and femoral spaces. The mesh was secured into place superiorly to the anterior abdominal wall and inferiorly and medially to Cooper's ligament with absorbable tacks.  Care was taken to avoid the inferolateral triangles containing the iliac vessels and genital nerves. The anterior fascia of the Left umbilical incision was also closed with a figure of 8 vicryl 0 suture.  Through a separate incision, the umbilical hernia was dissected, the hernia sac was opened. A 11 mm trocar was inserted and ventral wall was inspected and no hernia was seen. The umbilical hernia was 1.5 cm. A 6.4 cm round mesh was placed. It was fixed in the four cardinal points. The anterior abdominal wall was closed with multiple 0-Vicryl sutures. The umbilical stalk was fixed to anterior abdominal wall with 2-0 vicryl. Peri umbilical skin closed with 4-0 Monocryl.  The trocar incisions were closed using monocryl and skin adhesive dressings  applied.  The patient tolerated the procedure well and was taken to the postanesthesia care unit in stable condition.    Specimen: Umbilical hernia content   Complications: None   Estimated Blood Loss: 10 mL

## 2018-03-01 NOTE — Anesthesia Preprocedure Evaluation (Signed)
Anesthesia Evaluation  Patient identified by MRN, date of birth, ID band Patient awake    Reviewed: Allergy & Precautions, H&P , NPO status , Patient's Chart, lab work & pertinent test results, reviewed documented beta blocker date and time   Airway Mallampati: II  TM Distance: >3 FB Neck ROM: full    Dental  (+) Teeth Intact   Pulmonary neg pulmonary ROS, former smoker,    Pulmonary exam normal        Cardiovascular Exercise Tolerance: Good hypertension, On Medications (-) angina(-) CAD, (-) Past MI, (-) CHF, (-) Orthopnea, (-) PND and (-) DOE negative cardio ROS Normal cardiovascular exam Rhythm:regular Rate:Normal     Neuro/Psych negative neurological ROS  negative psych ROS   GI/Hepatic negative GI ROS, Neg liver ROS,   Endo/Other  negative endocrine ROS  Renal/GU negative Renal ROS  negative genitourinary   Musculoskeletal   Abdominal   Peds  Hematology negative hematology ROS (+)   Anesthesia Other Findings Past Medical History: No date: Anxiety     Comment:  H/O No date: Arthritis No date: History of claustrophobia No date: Hypertension No date: Palpitations Past Surgical History: No date: CYST EXCISION     Comment:  HAND No date: SHOULDER SURGERY; Right     Comment:  AGE 43 BMI    Body Mass Index:  30.99 kg/m     Reproductive/Obstetrics negative OB ROS                             Anesthesia Physical Anesthesia Plan  ASA: II  Anesthesia Plan: General ETT   Post-op Pain Management:    Induction:   PONV Risk Score and Plan: 3  Airway Management Planned:   Additional Equipment:   Intra-op Plan:   Post-operative Plan:   Informed Consent: I have reviewed the patients History and Physical, chart, labs and discussed the procedure including the risks, benefits and alternatives for the proposed anesthesia with the patient or authorized representative who has  indicated his/her understanding and acceptance.     Dental Advisory Given  Plan Discussed with: CRNA  Anesthesia Plan Comments:         Anesthesia Quick Evaluation

## 2018-03-01 NOTE — Anesthesia Post-op Follow-up Note (Signed)
Anesthesia QCDR form completed.        

## 2018-03-01 NOTE — Transfer of Care (Signed)
Immediate Anesthesia Transfer of Care Note  Patient: Charles Mcintyre  Procedure(s) Performed: OPEN INGUINAL HERNIA WITH MESH (Right Inguinal) LAPAROSCOPIC HERNIA REPAIR UMBILICAL ADULT WITH MESH (N/A )  Patient Location: PACU  Anesthesia Type:General  Level of Consciousness: sedated  Airway & Oxygen Therapy: Patient connected to face mask oxygen  Post-op Assessment: Post -op Vital signs reviewed and stable  Post vital signs: stable  Last Vitals:  Vitals Value Taken Time  BP 101/48 03/01/2018  3:12 PM  Temp    Pulse 66 03/01/2018  3:12 PM  Resp 16 03/01/2018  3:12 PM  SpO2 98 % 03/01/2018  3:12 PM  Vitals shown include unvalidated device data.  Last Pain:  Vitals:   03/01/18 1046  TempSrc: Temporal  PainSc: 0-No pain         Complications: No apparent anesthesia complications

## 2018-03-01 NOTE — Discharge Instructions (Signed)
  Diet: Resume home heart healthy regular diet.   Activity: No heavy lifting >20 pounds (children, pets, laundry, garbage) or strenuous activity until follow-up, but light activity and walking are encouraged. Do not drive or drink alcohol if taking narcotic pain medications.  Wound care: Remove dressing tomorrow. Once dressing removed, may shower with soapy water and pat dry (do not rub incisions), but no baths or submerging incision underwater until follow-up. (no swimming)   Medications: Resume all home medications. For mild to moderate pain: acetaminophen (Tylenol) or ibuprofen (if no kidney disease). Combining Tylenol with alcohol can substantially increase your risk of causing liver disease. Narcotic pain medications, if prescribed, can be used for severe pain, though may cause nausea, constipation, and drowsiness. Do not combine Tylenol and Norco within a 6 hour period as Norco contains Tylenol. If you do not need the narcotic pain medication, you do not need to fill the prescription.  Call office [phone]) at any time if any questions, worsening pain, fevers/chills, bleeding, drainage from incision site, or other concerns.  AMBULATORY SURGERY  DISCHARGE INSTRUCTIONS   1) The drugs that you were given will stay in your system until tomorrow so for the next 24 hours you should not:  A) Drive an automobile B) Make any legal decisions C) Drink any alcoholic beverage   2) You may resume regular meals tomorrow.  Today it is better to start with liquids and gradually work up to solid foods.  You may eat anything you prefer, but it is better to start with liquids, then soup and crackers, and gradually work up to solid foods.   3) Please notify your doctor immediately if you have any unusual bleeding, trouble breathing, redness and pain at the surgery site, drainage, fever, or pain not relieved by medication.    4) Additional Instructions: TAKE A STOOL SOFTENER TWICE A DAY WHILE  TAKING NARCOTIC PAIN MEDICINE TO PREVENT CONSTIPATION   Please contact your physician with any problems or Same Day Surgery at [phone], Monday through Friday 6 am to 4 pm, or [place] at [facility] number at [phone].

## 2018-03-01 NOTE — Interval H&P Note (Signed)
History and Physical Interval Note:  03/01/2018 11:40 AM  Charles Mcintyre  has presented today for surgery, with the diagnosis of NON RECURRENT UNILATERAL INGUINAL HERNIA W/O OBSTRUCTION OR GANGRENE, UMBILICAL HERNIA W/O OBSTRUCTION OR GANGRENE, VENTRAL HERNIA W/O OBSTRUCTION OR GANGRENE  The various methods of treatment have been discussed with the patient and family. After consideration of risks, benefits and other options for treatment, the patient has consented to  Procedure(s): LAPAROSCOPIC INGUINAL HERNIA WITH MESH (Right) HERNIA REPAIR UMBILICAL ADULT WITH MESH (N/A) POSSIBLE VENTRAL HERNIA WITH MESH OPEN VS. LAPAROSCOPIC (N/A) as a surgical intervention .  The patient's history has been reviewed, patient examined, no change in status, stable for surgery.  I have reviewed the patient's chart and labs.  Right inguinal area marked in the pre procedure room. Questions were answered to the patient's satisfaction.     Carolan Shiver

## 2018-03-01 NOTE — Anesthesia Procedure Notes (Addendum)
Procedure Name: Intubation Date/Time: 03/01/2018 12:22 PM Performed by: Jenel Lucks, RN Pre-anesthesia Checklist: Patient identified, Emergency Drugs available, Suction available, Patient being monitored and Timeout performed Patient Re-evaluated:Patient Re-evaluated prior to induction Oxygen Delivery Method: Circle system utilized Preoxygenation: Pre-oxygenation with 100% oxygen Induction Type: IV induction Ventilation: Mask ventilation without difficulty Laryngoscope Size: Miller and 2 Grade View: Grade I Tube type: Oral Tube size: 8.0 mm Number of attempts: 1 Airway Equipment and Method: Stylet Placement Confirmation: ETT inserted through vocal cords under direct vision,  positive ETCO2 and breath sounds checked- equal and bilateral Secured at: 23 cm Tube secured with: Tape Dental Injury: Teeth and Oropharynx as per pre-operative assessment

## 2018-03-04 ENCOUNTER — Encounter: Payer: Self-pay | Admitting: General Surgery

## 2018-03-04 NOTE — Anesthesia Postprocedure Evaluation (Signed)
Anesthesia Post Note  Patient: [name]  Procedure(s) Performed: LAPAROSCOPIC INGUINAL HERNIA (Right ) HERNIA REPAIR UMBILICAL ADULT  Patient location during evaluation: PACU Anesthesia Type: General Level of consciousness: awake and alert Pain management: pain level controlled Vital Signs Assessment: post-procedure vital signs reviewed and stable Respiratory status: spontaneous breathing, nonlabored ventilation, respiratory function stable and patient connected to nasal cannula oxygen Cardiovascular status: blood pressure returned to baseline and stable Postop Assessment: no apparent nausea or vomiting Anesthetic complications: no     Last Vitals:  Vitals:   03/01/18 1626 03/01/18 1651  BP: 126/67 131/73  Pulse: 66 62  Resp: 16 16  Temp: 36.4 C   SpO2: 96% 100%    Last Pain:  Vitals:   03/04/18 0819  TempSrc:   PainSc: 4                  Lenard SimmerAndrew Khiry Pasquariello

## 2018-03-05 LAB — SURGICAL PATHOLOGY

## 2018-09-08 IMAGING — CT CT ABD-PELV W/ CM
2 of 5 series · 16 of 46 positions shown, 18 images · IV contrast (iopamidol)
Comparison: None.

CLINICAL DATA: Right inguinal pain for 1 month

EXAM:
CT ABDOMEN AND PELVIS WITH CONTRAST
TECHNIQUE: Multidetector CT imaging of the abdomen and pelvis was performed
using the standard protocol following bolus administration of
intravenous contrast.
CONTRAST:  100mL T5LWH2-DOO IOPAMIDOL (T5LWH2-DOO) INJECTION 61%

[Series 2: abd pelvis · axial · 0.82mm/px · z∈[-1658,-1218]mm · 13 of 100 slices shown, 15 images (1 of 2)]
[im 6/100  soft-tissue]
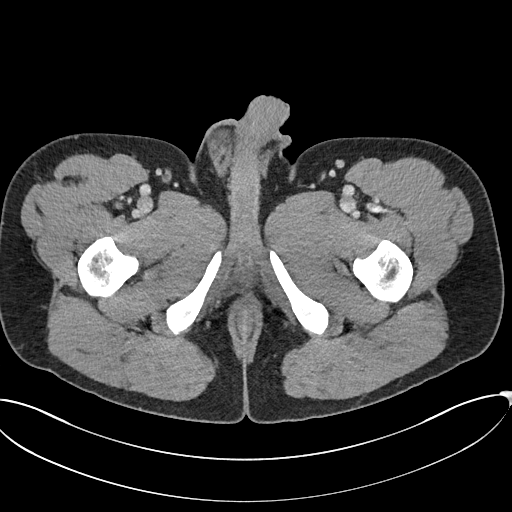
[im 6/100  bone]
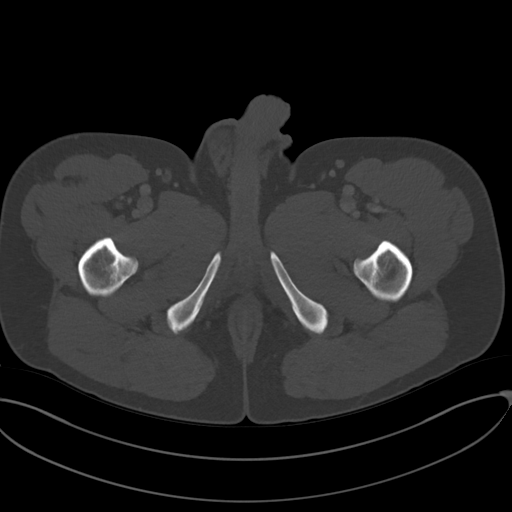
[im 16/100  soft-tissue]
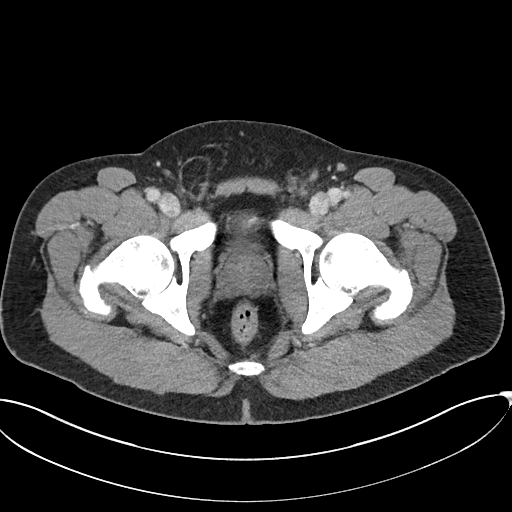
[im 21/100  soft-tissue]
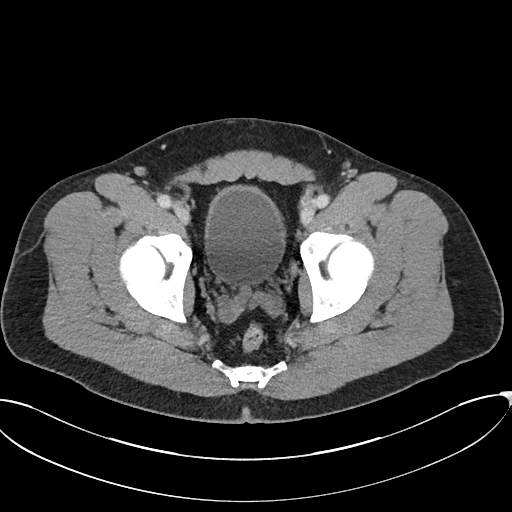
[im 27/100  soft-tissue]
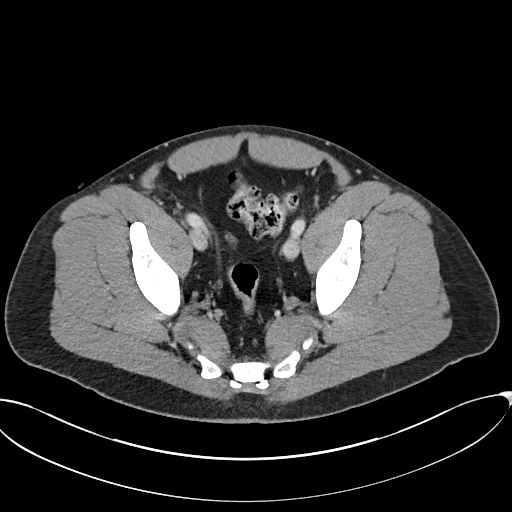
[im 37/100  soft-tissue]
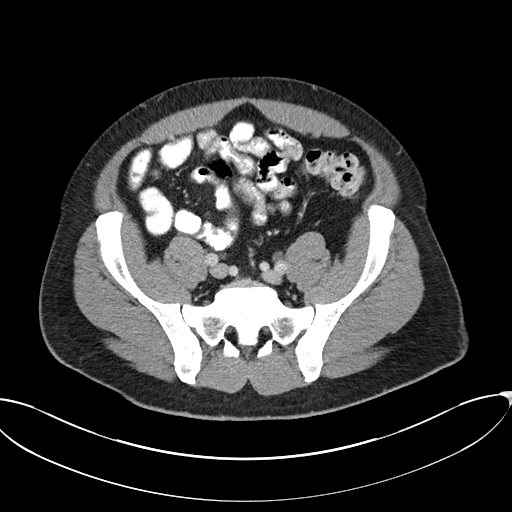
[im 42/100  soft-tissue]
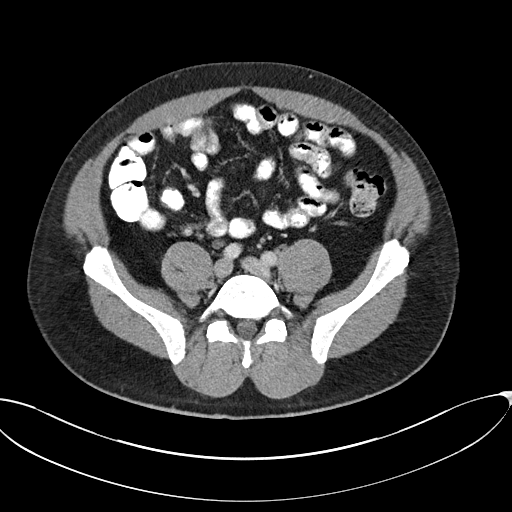
[im 53/100  soft-tissue]
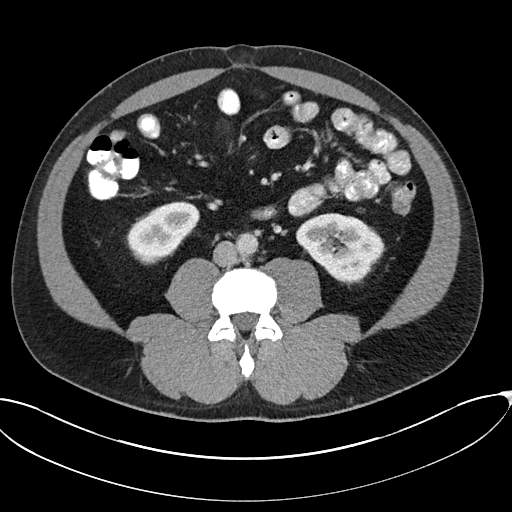
[im 58/100  soft-tissue]
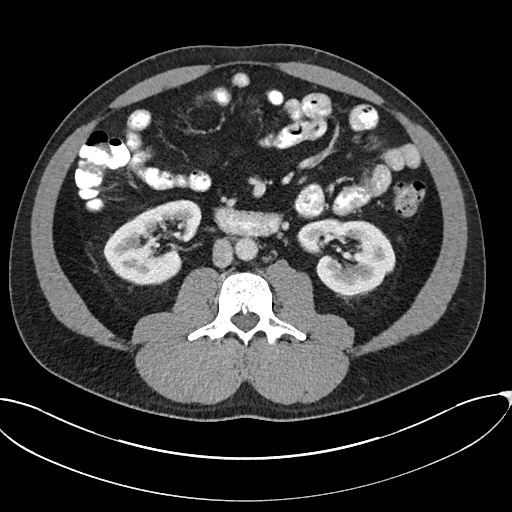
[im 63/100  soft-tissue]
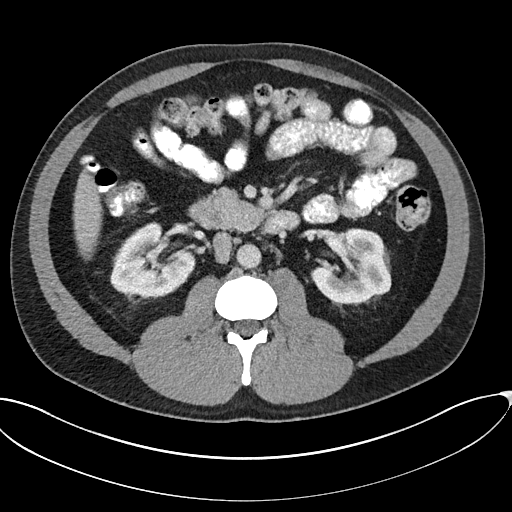
[im 63/100  bone]
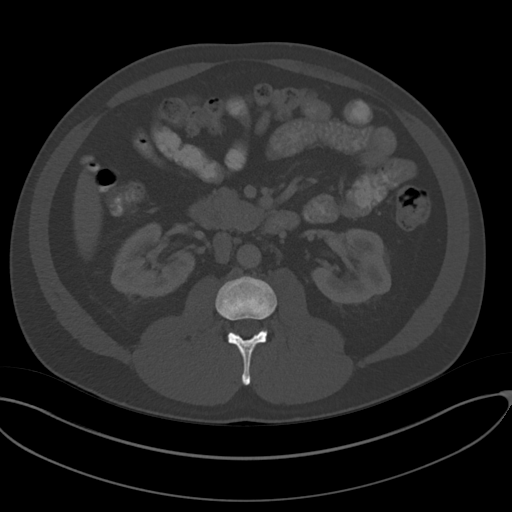
[im 73/100  soft-tissue]
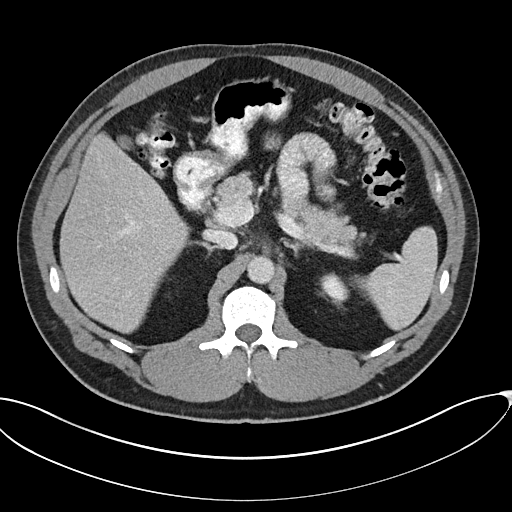
[im 79/100  soft-tissue]
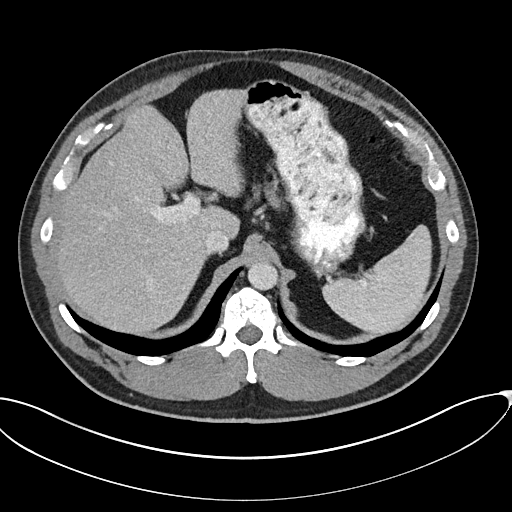
[im 84/100  soft-tissue]
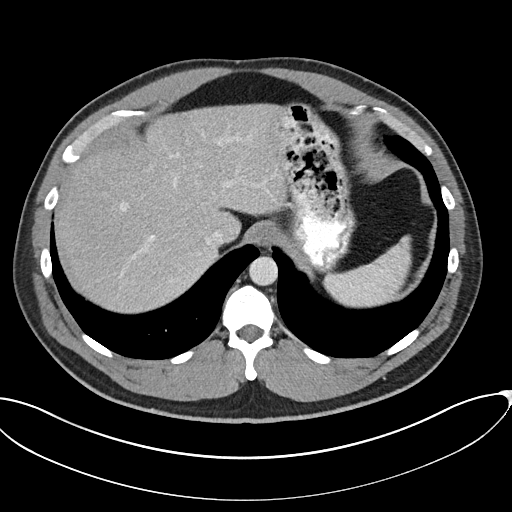
[im 94/100  soft-tissue]
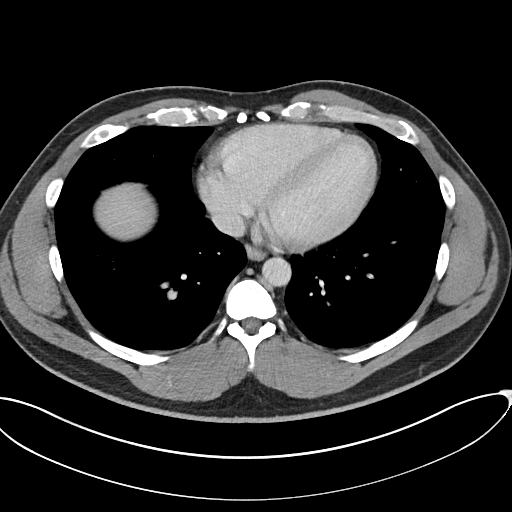

[Series 4: abd pelvis · coronal · 0.82mm/px · 3 of 168 slices shown (2 of 2)]
[im 56/168  soft-tissue]
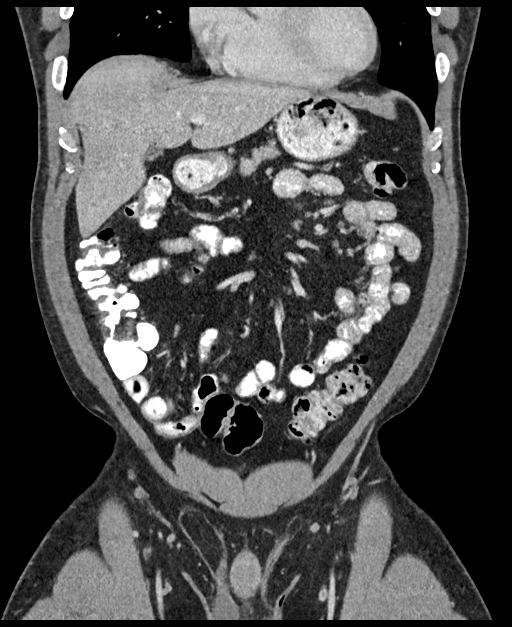
[im 75/168  soft-tissue]
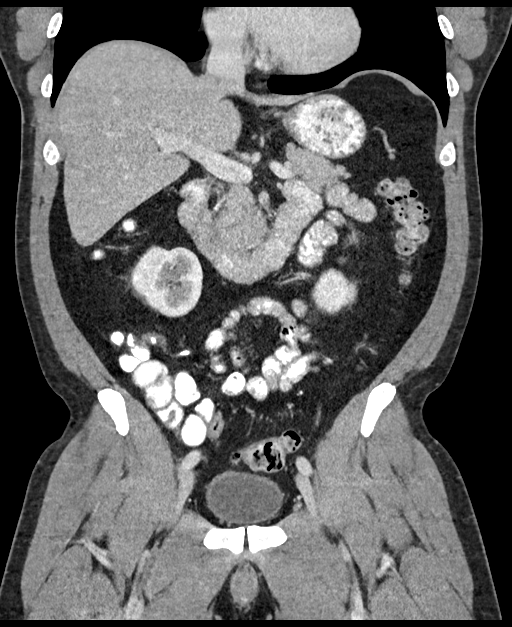
[im 93/168  soft-tissue]
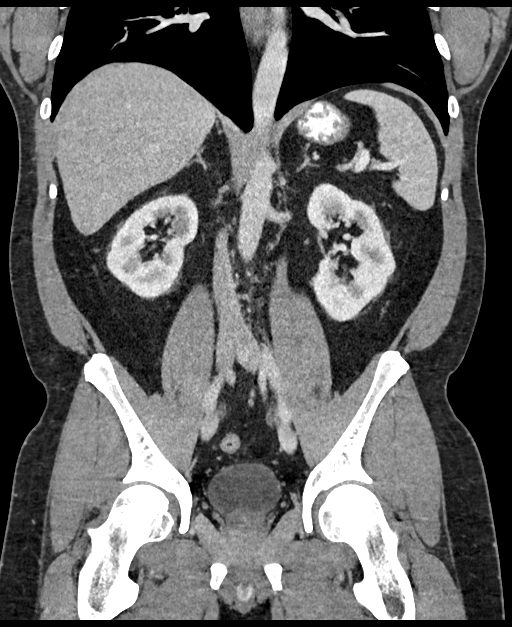

[16 of 46 positions shown; findings below may reference images not displayed]

FINDINGS: Lower chest: No acute abnormality.

Hepatobiliary: Mild fatty infiltration of the liver is noted. The
gallbladder is decompressed.

Pancreas: Unremarkable. No pancreatic ductal dilatation or
surrounding inflammatory changes.

Spleen: Normal in size without focal abnormality.

Adrenals/Urinary Tract: Adrenal glands are unremarkable. Kidneys are
normal, without renal calculi, focal lesion, or hydronephrosis.
Bladder is unremarkable.

Stomach/Bowel: Stomach is within normal limits. Appendix appears
normal. No evidence of bowel wall thickening, distention, or
inflammatory changes.

Vascular/Lymphatic: No significant vascular findings are present. No
enlarged abdominal or pelvic lymph nodes.

Reproductive: Prostate is unremarkable.

Other: Fat containing right inguinal hernia is noted. No bowel is
noted within.

Musculoskeletal: No acute or significant osseous findings.
IMPRESSION: Fat containing right inguinal hernia.

Mild fatty infiltration of the liver.

## 2019-12-26 ENCOUNTER — Other Ambulatory Visit: Payer: Self-pay | Admitting: Sports Medicine

## 2019-12-26 DIAGNOSIS — G8929 Other chronic pain: Secondary | ICD-10-CM

## 2019-12-26 DIAGNOSIS — M25511 Pain in right shoulder: Secondary | ICD-10-CM

## 2019-12-31 ENCOUNTER — Ambulatory Visit
Admission: RE | Admit: 2019-12-31 | Discharge: 2019-12-31 | Disposition: A | Discharge status: Home / Self Care | Payer: Managed Care, Other (non HMO) | Source: Ambulatory Visit | Attending: Sports Medicine | Admitting: Sports Medicine

## 2019-12-31 ENCOUNTER — Other Ambulatory Visit: Payer: Self-pay

## 2019-12-31 DIAGNOSIS — X500XXA Overexertion from strenuous movement or load, initial encounter: Secondary | ICD-10-CM | POA: Diagnosis not present

## 2019-12-31 DIAGNOSIS — M19011 Primary osteoarthritis, right shoulder: Secondary | ICD-10-CM | POA: Insufficient documentation

## 2019-12-31 DIAGNOSIS — M25511 Pain in right shoulder: Secondary | ICD-10-CM | POA: Insufficient documentation

## 2019-12-31 DIAGNOSIS — G8929 Other chronic pain: Secondary | ICD-10-CM | POA: Insufficient documentation

## 2019-12-31 MED ORDER — IOHEXOL 180 MG/ML  SOLN
20.0000 mL | Freq: Once | INTRAMUSCULAR | Status: AC | PRN
  Administered 2019-12-31: 15 mL

## 2019-12-31 MED ORDER — LIDOCAINE HCL (PF) 1 % IJ SOLN
30.0000 mL | Freq: Once | INTRAMUSCULAR | Status: AC
  Administered 2019-12-31: 20 mL
  Filled 2019-12-31: qty 30

## 2019-12-31 MED ORDER — SODIUM CHLORIDE (PF) 0.9 % IJ SOLN
10.0000 mL | INTRAMUSCULAR | Status: DC | PRN
  Administered 2019-12-31: 5 mL

## 2019-12-31 MED ORDER — GADOBUTROL 1 MMOL/ML IV SOLN
2.0000 mL/kg | Freq: Once | INTRAVENOUS | Status: AC | PRN
  Administered 2019-12-31: 0.5 mL

## 2021-11-29 ENCOUNTER — Other Ambulatory Visit: Payer: Self-pay | Admitting: Surgery

## 2021-11-29 DIAGNOSIS — M7521 Bicipital tendinitis, right shoulder: Secondary | ICD-10-CM

## 2021-11-29 DIAGNOSIS — M7581 Other shoulder lesions, right shoulder: Secondary | ICD-10-CM

## 2021-11-29 DIAGNOSIS — M25511 Pain in right shoulder: Secondary | ICD-10-CM

## 2022-10-31 ENCOUNTER — Other Ambulatory Visit: Payer: Self-pay | Admitting: Student

## 2022-10-31 DIAGNOSIS — M7581 Other shoulder lesions, right shoulder: Secondary | ICD-10-CM

## 2022-10-31 DIAGNOSIS — Z9889 Other specified postprocedural states: Secondary | ICD-10-CM

## 2022-10-31 DIAGNOSIS — M19111 Post-traumatic osteoarthritis, right shoulder: Secondary | ICD-10-CM

## 2022-10-31 DIAGNOSIS — M7521 Bicipital tendinitis, right shoulder: Secondary | ICD-10-CM
